# Patient Record
Sex: Male | Born: 2005 | Race: White | Hispanic: No | Marital: Single | Smoking: Never smoker
Health system: Southern US, Community
[De-identification: ages and names within clinical notes are randomized; demographics above are authoritative.]

## PROBLEM LIST (undated history)

## (undated) DIAGNOSIS — T7840XA Allergy, unspecified, initial encounter: Secondary | ICD-10-CM

## (undated) DIAGNOSIS — R590 Localized enlarged lymph nodes: Secondary | ICD-10-CM

## (undated) DIAGNOSIS — F32A Depression, unspecified: Secondary | ICD-10-CM

## (undated) HISTORY — PX: LYMPH GLAND EXCISION: SHX13

---
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
## 2024-01-15 ENCOUNTER — Encounter: Payer: Self-pay | Admitting: Sports Medicine
